# Patient Record
Sex: Female | Born: 1990 | Race: Black or African American | Hispanic: No | Marital: Married | Smoking: Never smoker
Health system: Southern US, Community
[De-identification: ages and names within clinical notes are randomized; demographics above are authoritative.]

## PROBLEM LIST (undated history)

## (undated) DIAGNOSIS — J45909 Unspecified asthma, uncomplicated: Secondary | ICD-10-CM

---

## 2018-10-29 ENCOUNTER — Emergency Department (HOSPITAL_BASED_OUTPATIENT_CLINIC_OR_DEPARTMENT_OTHER)
Admission: EM | Admit: 2018-10-29 | Discharge: 2018-10-29 | Disposition: A | Discharge status: Home / Self Care | Payer: Medicaid Other | Attending: Emergency Medicine | Admitting: Emergency Medicine

## 2018-10-29 ENCOUNTER — Other Ambulatory Visit: Payer: Self-pay

## 2018-10-29 ENCOUNTER — Encounter (HOSPITAL_BASED_OUTPATIENT_CLINIC_OR_DEPARTMENT_OTHER): Payer: Self-pay | Admitting: Emergency Medicine

## 2018-10-29 DIAGNOSIS — O99612 Diseases of the digestive system complicating pregnancy, second trimester: Secondary | ICD-10-CM | POA: Insufficient documentation

## 2018-10-29 DIAGNOSIS — R109 Unspecified abdominal pain: Secondary | ICD-10-CM | POA: Insufficient documentation

## 2018-10-29 DIAGNOSIS — Z3A27 27 weeks gestation of pregnancy: Secondary | ICD-10-CM

## 2018-10-29 HISTORY — DX: Unspecified asthma, uncomplicated: J45.909

## 2018-10-29 NOTE — ED Notes (Signed)
[facility] RN contacted [facility] RN about difficulty tracing HR. Pt. Now on monitor.   G3P2 27 weeks followed by [facility] ([facility]) and c/o lower abd pain x1week. Hx of uterine fibroid. Scheduled for ultrasound with her provider this week. Pt. Rating pain 9/10. Do not have access to prenatal records at this time.

## 2018-10-29 NOTE — ED Notes (Signed)
Category I FHR tracing. No uterine ctx noted via toco. Pt. Denies LOF or vaginal bleeding. Spoke with attending MD Dr. Elonda Husky. He is advising for her to keep her follow up appointment with her OBGYN this week. May remove from FHR monitoring at this time. Updated [facility] RN.

## 2018-10-29 NOTE — ED Provider Notes (Signed)
[facility] Provider Note   CSN: [id number] Arrival date & time: 10/29/18  1758     History   Chief Complaint Chief Complaint  Patient presents with  . Abdominal Pain    [redacted]  weeks pregnant    HPI Essynce Ozier is a 28 y.o. female.     Patient is [redacted] weeks pregnant.  Gravida 3 para 2 estimated delivery date is November 17.  Patient's been having some intermittent right flank right sided abdominal pain that felt like a little bit of contractions for the past several days.  Patient is followed by OB/GYN in the [place] area.  Is been no problems with this pregnancy to date.  Patient denies any vaginal bleeding or any discharge or breaking of her water.  Patient without any fevers or upper respiratory symptoms.     Past Medical History:  Diagnosis Date  . Asthma     There are no active problems to display for this patient.   History reviewed. No pertinent surgical history.   OB History    Gravida  1   Para      Term      Preterm      AB      Living        SAB      TAB      Ectopic      Multiple      Live Births               Home Medications    Prior to Admission medications   Not on File    Family History No family history on file.  Social History Social History   Tobacco Use  . Smoking status: Never Smoker  . Smokeless tobacco: Never Used  Substance Use Topics  . Alcohol use: Never    Frequency: Never  . Drug use: Never     Allergies   Patient has no known allergies.   Review of Systems Review of Systems  Constitutional: Negative for chills and fever.  HENT: Negative for rhinorrhea and sore throat.   Eyes: Negative for visual disturbance.  Respiratory: Negative for cough and shortness of breath.   Cardiovascular: Negative for chest pain and leg swelling.  Gastrointestinal: Positive for abdominal pain. Negative for diarrhea, nausea and vomiting.  Genitourinary: Negative for  dysuria, vaginal bleeding and vaginal discharge.  Musculoskeletal: Negative for back pain and neck pain.  Skin: Negative for rash.  Neurological: Negative for dizziness, light-headedness and headaches.  Hematological: Does not bruise/bleed easily.  Psychiatric/Behavioral: Negative for confusion.     Physical Exam Updated Vital Signs BP 125/73 (BP Location: Right Arm)   Pulse (!) 103   Temp 98.6 F (37 C) (Oral)   Resp 20   Ht 1.575 m (5\' 2" )   Wt 67.6 kg   LMP 04/18/2018 Comment: Gravida 3, Para 2, AB 0  SpO2 98%   BMI 27.25 kg/m   Physical Exam Vitals signs and nursing note reviewed.  Constitutional:      General: She is not in acute distress.    Appearance: Normal appearance. She is well-developed.  HENT:     Head: Normocephalic and atraumatic.  Eyes:     Extraocular Movements: Extraocular movements intact.     Conjunctiva/sclera: Conjunctivae normal.     Pupils: Pupils are equal, round, and reactive to light.  Neck:     Musculoskeletal: Normal range of motion and neck supple.  Cardiovascular:  Rate and Rhythm: Normal rate and regular rhythm.     Heart sounds: No murmur.  Pulmonary:     Effort: Pulmonary effort is normal. No respiratory distress.     Breath sounds: Normal breath sounds.  Abdominal:     Palpations: Abdomen is soft.     Tenderness: There is no abdominal tenderness. There is no guarding.     Comments: Abdomen soft nontender.  Gravid uterus to the umbilicus.  Musculoskeletal: Normal range of motion.  Skin:    General: Skin is warm and dry.     Capillary Refill: Capillary refill takes less than 2 seconds.  Neurological:     General: No focal deficit present.     Mental Status: She is alert and oriented to person, place, and time.      ED Treatments / Results  Labs (all labs ordered are listed, but only abnormal results are displayed) Labs Reviewed - No data to display  EKG None  Radiology No results found.  Procedures Procedures  (including critical care time)  Medications Ordered in ED Medications - No data to display   Initial Impression / Assessment and Plan / ED Course  I have reviewed the triage vital signs and the nursing notes.  Pertinent labs & imaging results that were available during my care of the patient were reviewed by me and considered in my medical decision making (see chart for details).        Maternal fetal monitoring completed.  Discussions with [facility].  Patient cleared for discharge home and follow-up with her OB/GYN doctors this week.  No evidence of any labor.  No evidence of any complications.  Final Clinical Impressions(s) / ED Diagnoses   Final diagnoses:  [redacted] weeks gestation of pregnancy    ED Discharge Orders    None       Fredia Sorrow, MD 10/29/18 [phone]

## 2018-10-29 NOTE — ED Notes (Signed)
Spoke with Jenny Reichmann, RN who is caring for pt @ [facility].  Pt has been admitted to [facility] and has started tracing for evaluation by [facility].  Monitor is currently tracing maternal heart rate so adjustment requested.

## 2018-10-29 NOTE — ED Notes (Signed)
Spoke with [place] from [facility].  Still unable to get HR to pick up for more than a second or two.  To call me back.

## 2018-10-29 NOTE — ED Notes (Signed)
Spoke with Stanton Kidney RN with [facility]

## 2018-10-29 NOTE — Discharge Instructions (Addendum)
Keep your appointment that you have with your OB/GYN for later this week.  Get seen for any vaginal bleeding or breaking of your water.  Are any worse symptoms.

## 2018-10-29 NOTE — ED Triage Notes (Signed)
Has been having abd pain, since Monday and is scheduled for a U/S by OB-GYN on 8/30.

## 2018-10-29 NOTE — ED Notes (Addendum)
Spoke with [place] from [facility].  Reports baby looks good and patient just needs to keep her previous appointment for [place].  Dr Rogene Houston made aware.

## 2018-10-29 NOTE — ED Notes (Signed)
ED Provider at bedside. 

## 2018-10-29 NOTE — ED Notes (Signed)
Patient aware of need for urine specimen.  To let me know when she can go.

## 2020-12-15 ENCOUNTER — Other Ambulatory Visit: Payer: Self-pay

## 2020-12-15 ENCOUNTER — Encounter (HOSPITAL_BASED_OUTPATIENT_CLINIC_OR_DEPARTMENT_OTHER): Payer: Self-pay

## 2020-12-15 ENCOUNTER — Emergency Department (HOSPITAL_BASED_OUTPATIENT_CLINIC_OR_DEPARTMENT_OTHER): Payer: Medicaid Other

## 2020-12-15 ENCOUNTER — Emergency Department (HOSPITAL_BASED_OUTPATIENT_CLINIC_OR_DEPARTMENT_OTHER)
Admission: EM | Admit: 2020-12-15 | Discharge: 2020-12-15 | Disposition: A | Discharge status: Home / Self Care | Payer: Medicaid Other | Attending: Student | Admitting: Student

## 2020-12-15 DIAGNOSIS — E876 Hypokalemia: Secondary | ICD-10-CM | POA: Insufficient documentation

## 2020-12-15 DIAGNOSIS — D259 Leiomyoma of uterus, unspecified: Secondary | ICD-10-CM | POA: Diagnosis not present

## 2020-12-15 DIAGNOSIS — E871 Hypo-osmolality and hyponatremia: Secondary | ICD-10-CM | POA: Insufficient documentation

## 2020-12-15 DIAGNOSIS — R110 Nausea: Secondary | ICD-10-CM | POA: Insufficient documentation

## 2020-12-15 DIAGNOSIS — Z20822 Contact with and (suspected) exposure to covid-19: Secondary | ICD-10-CM | POA: Insufficient documentation

## 2020-12-15 DIAGNOSIS — D649 Anemia, unspecified: Secondary | ICD-10-CM | POA: Insufficient documentation

## 2020-12-15 DIAGNOSIS — J45909 Unspecified asthma, uncomplicated: Secondary | ICD-10-CM | POA: Diagnosis not present

## 2020-12-15 DIAGNOSIS — R1032 Left lower quadrant pain: Secondary | ICD-10-CM | POA: Diagnosis present

## 2020-12-15 LAB — URINALYSIS, ROUTINE W REFLEX MICROSCOPIC
Bilirubin Urine: NEGATIVE
Glucose, UA: NEGATIVE mg/dL
Hgb urine dipstick: NEGATIVE
Ketones, ur: NEGATIVE mg/dL
Leukocytes,Ua: NEGATIVE
Nitrite: NEGATIVE
Protein, ur: NEGATIVE mg/dL
Specific Gravity, Urine: 1.025 (ref 1.005–1.030)
pH: 5.5 (ref 5.0–8.0)

## 2020-12-15 LAB — COMPREHENSIVE METABOLIC PANEL
ALT: 20 U/L (ref 0–44)
AST: 22 U/L (ref 15–41)
Albumin: 3.8 g/dL (ref 3.5–5.0)
Alkaline Phosphatase: 73 U/L (ref 38–126)
Anion gap: 7 (ref 5–15)
BUN: 9 mg/dL (ref 6–20)
CO2: 24 mmol/L (ref 22–32)
Calcium: 8.8 mg/dL — ABNORMAL LOW (ref 8.9–10.3)
Chloride: 103 mmol/L (ref 98–111)
Creatinine, Ser: 0.63 mg/dL (ref 0.44–1.00)
GFR, Estimated: >60 mL/min (ref >60)
Glucose, Bld: 99 mg/dL (ref 70–99)
Potassium: 3.4 mmol/L — ABNORMAL LOW (ref 3.5–5.1)
Sodium: 134 mmol/L — ABNORMAL LOW (ref 135–145)
Total Bilirubin: 0.1 mg/dL — ABNORMAL LOW (ref 0.3–1.2)
Total Protein: 8.4 g/dL — ABNORMAL HIGH (ref 6.5–8.1)

## 2020-12-15 LAB — CBC WITH DIFFERENTIAL/PLATELET
Abs Immature Granulocytes: 0.03 10*3/uL (ref 0.00–0.07)
Basophils Absolute: 0.1 10*3/uL (ref 0.0–0.1)
Basophils Relative: 1 %
Eosinophils Absolute: 0.2 10*3/uL (ref 0.0–0.5)
Eosinophils Relative: 2 %
HCT: 23.8 % — ABNORMAL LOW (ref 36.0–46.0)
Hemoglobin: 6.8 g/dL — CL (ref 12.0–15.0)
Immature Granulocytes: 0 %
Lymphocytes Relative: 19 %
Lymphs Abs: 2 10*3/uL (ref 0.7–4.0)
MCH: 17 pg — ABNORMAL LOW (ref 26.0–34.0)
MCHC: 28.6 g/dL — ABNORMAL LOW (ref 30.0–36.0)
MCV: 59.4 fL — ABNORMAL LOW (ref 80.0–100.0)
Monocytes Absolute: 0.6 10*3/uL (ref 0.1–1.0)
Monocytes Relative: 6 %
Neutro Abs: 7.4 10*3/uL (ref 1.7–7.7)
Neutrophils Relative %: 72 %
Platelets: 615 10*3/uL — ABNORMAL HIGH (ref 150–400)
RBC: 4.01 MIL/uL (ref 3.87–5.11)
RDW: 21 % — ABNORMAL HIGH (ref 11.5–15.5)
WBC: 10.2 10*3/uL (ref 4.0–10.5)
nRBC: 0 % (ref 0.0–0.2)

## 2020-12-15 LAB — LIPASE, BLOOD: Lipase: 37 U/L (ref 11–51)

## 2020-12-15 LAB — ABO/RH: ABO/RH(D): O POS

## 2020-12-15 LAB — PREPARE RBC (CROSSMATCH)

## 2020-12-15 LAB — RESP PANEL BY RT-PCR (FLU A&B, COVID) ARPGX2
Influenza A by PCR: NEGATIVE
Influenza B by PCR: NEGATIVE
SARS Coronavirus 2 by RT PCR: NEGATIVE

## 2020-12-15 LAB — PREGNANCY, URINE: Preg Test, Ur: NEGATIVE

## 2020-12-15 MED ORDER — ALBUTEROL SULFATE HFA 108 (90 BASE) MCG/ACT IN AERS
2.0000 | INHALATION_SPRAY | Freq: Once | RESPIRATORY_TRACT | Status: AC
  Administered 2020-12-15: 2 via RESPIRATORY_TRACT
  Filled 2020-12-15: qty 6.7

## 2020-12-15 MED ORDER — IOHEXOL 300 MG/ML  SOLN
100.0000 mL | Freq: Once | INTRAMUSCULAR | Status: AC | PRN
  Administered 2020-12-15: 100 mL via INTRAVENOUS

## 2020-12-15 MED ORDER — ONDANSETRON HCL 4 MG/2ML IJ SOLN
4.0000 mg | Freq: Once | INTRAMUSCULAR | Status: AC | PRN
  Administered 2020-12-15: 4 mg via INTRAVENOUS
  Filled 2020-12-15: qty 2

## 2020-12-15 MED ORDER — MORPHINE SULFATE (PF) 4 MG/ML IV SOLN
4.0000 mg | Freq: Once | INTRAVENOUS | Status: AC
  Administered 2020-12-15: 4 mg via INTRAVENOUS
  Filled 2020-12-15: qty 1

## 2020-12-15 MED ORDER — KETOROLAC TROMETHAMINE 15 MG/ML IJ SOLN
15.0000 mg | Freq: Once | INTRAMUSCULAR | Status: AC
  Administered 2020-12-15: 15 mg via INTRAVENOUS
  Filled 2020-12-15: qty 1

## 2020-12-15 MED ORDER — HYDROCODONE-ACETAMINOPHEN 5-325 MG PO TABS
1.0000 | ORAL_TABLET | Freq: Once | ORAL | Status: AC
  Administered 2020-12-15: 1 via ORAL
  Filled 2020-12-15: qty 1

## 2020-12-15 MED ORDER — SODIUM CHLORIDE 0.9 % IV SOLN
10.0000 mL/h | Freq: Once | INTRAVENOUS | Status: AC
  Administered 2020-12-15: 10 mL/h via INTRAVENOUS

## 2020-12-15 MED ORDER — FERROUS SULFATE 325 (65 FE) MG PO TABS
325.0000 mg | ORAL_TABLET | Freq: Every day | ORAL | 0 refills | Status: AC
Start: 2020-12-15 — End: ?

## 2020-12-15 MED ORDER — NAPROXEN 375 MG PO TABS: 375.0000 mg | ORAL_TABLET | Freq: Two times a day (BID) | ORAL | 0 refills | Status: AC

## 2020-12-15 MED ORDER — POTASSIUM CHLORIDE CRYS ER 20 MEQ PO TBCR
40.0000 meq | EXTENDED_RELEASE_TABLET | Freq: Once | ORAL | Status: AC
  Administered 2020-12-15: 40 meq via ORAL
  Filled 2020-12-15: qty 2

## 2020-12-15 MED ORDER — MORPHINE SULFATE (PF) 4 MG/ML IV SOLN
4.0000 mg | Freq: Once | INTRAVENOUS | Status: AC
Start: 2020-12-15 — End: 2020-12-15
  Administered 2020-12-15: 4 mg via INTRAVENOUS
  Filled 2020-12-15: qty 1

## 2020-12-15 NOTE — ED Triage Notes (Signed)
Pt c/o bilateral lower abdominal pain radiating into left flank intermittently x 1week. Denies urinary symptoms. States hx of fibroid.

## 2020-12-15 NOTE — ED Notes (Signed)
Ambulated pt to restroom without incident

## 2020-12-15 NOTE — ED Provider Notes (Signed)
4:14 PM Patient transferred from [facility] for blood transfusion. Is in 7/10 pain. Will give dose of morphine. She verbally consents to blood transfusion as well.   10:25 PM Transfusion done. Will d/c   Sherwood Gambler, MD 12/15/20 2225

## 2020-12-15 NOTE — ED Provider Notes (Addendum)
[facility] Provider Note   CSN: [id number] Arrival date & time: 12/15/20  0957     History Chief Complaint  Patient presents with   Abdominal Pain    Sharon Koch is a 30 y.o. female with PMH uterine fibroids who presents to the emergency department for evaluation of lower abdominal pain.  Patient states that her pain has been present for 1 week and is worse in the left lower quadrant but is also present in the suprapubic region and in the right lower quadrant.  She denies dysuria, vomiting, chest pain, shortness of breath, fever or other systemic symptoms.  She denies vaginal bleeding or discharge.  She states that she currently has to lie on her side to eat because sitting straight up causes significant nausea and worsening of her pain  after eating.  Of note, she also endorses positional presyncope and exertional fatigue.  Abdominal Pain Associated symptoms: fatigue and nausea   Associated symptoms: no chest pain, no chills, no cough, no dysuria, no fever, no hematuria, no shortness of breath, no sore throat and no vomiting       Past Medical History:  Diagnosis Date   Asthma     There are no problems to display for this patient.   History reviewed. No pertinent surgical history.   OB History     Gravida  1   Para      Term      Preterm      AB      Living         SAB      IAB      Ectopic      Multiple      Live Births              History reviewed. No pertinent family history.  Social History   Tobacco Use   Smoking status: Never   Smokeless tobacco: Never  Substance Use Topics   Alcohol use: Yes    Comment: occassional   Drug use: Yes    Types: Marijuana    Home Medications Prior to Admission medications   Not on File    Allergies    Shellfish allergy and Iodine  Review of Systems   Review of Systems  Constitutional:  Positive for appetite change and fatigue. Negative for chills and  fever.  HENT:  Negative for ear pain and sore throat.   Eyes:  Negative for pain and visual disturbance.  Respiratory:  Negative for cough and shortness of breath.   Cardiovascular:  Negative for chest pain and palpitations.  Gastrointestinal:  Positive for abdominal pain and nausea. Negative for vomiting.  Genitourinary:  Negative for dysuria and hematuria.  Musculoskeletal:  Negative for arthralgias and back pain.  Skin:  Negative for color change and rash.  Neurological:  Positive for syncope. Negative for seizures.  All other systems reviewed and are negative.  Physical Exam Updated Vital Signs BP 115/61 (BP Location: Right Arm)   Pulse 91   Temp 98.4 F (36.9 C) (Oral)   Resp 18   Ht 5\' 2"  (1.575 m)   Wt 68 kg   LMP 11/13/2020 (Approximate) Comment: Negative u-preg today  SpO2 100%   BMI 27.44 kg/m   Physical Exam Vitals and nursing note reviewed.  Constitutional:      General: She is not in acute distress.    Appearance: She is well-developed.  HENT:     Head: Normocephalic and atraumatic.  Eyes:     Conjunctiva/sclera: Conjunctivae normal.  Cardiovascular:     Rate and Rhythm: Normal rate and regular rhythm.     Heart sounds: No murmur heard. Pulmonary:     Effort: Pulmonary effort is normal. No respiratory distress.     Breath sounds: Normal breath sounds.  Abdominal:     Palpations: Abdomen is soft.     Tenderness: There is abdominal tenderness in the right lower quadrant, suprapubic area and left lower quadrant.     Comments: Palpable mass in lower abdomen  Musculoskeletal:     Cervical back: Neck supple.  Skin:    General: Skin is warm and dry.  Neurological:     Mental Status: She is alert.    ED Results / Procedures / Treatments   Labs (all labs ordered are listed, but only abnormal results are displayed) Labs Reviewed  CBC WITH DIFFERENTIAL/PLATELET - Abnormal; Notable for the following components:      Result Value   Hemoglobin 6.8 (*)    HCT  23.8 (*)    MCV 59.4 (*)    MCH 17.0 (*)    MCHC 28.6 (*)    RDW 21.0 (*)    Platelets 615 (*)    All other components within normal limits  COMPREHENSIVE METABOLIC PANEL - Abnormal; Notable for the following components:   Sodium 134 (*)    Potassium 3.4 (*)    Calcium 8.8 (*)    Total Protein 8.4 (*)    Total Bilirubin 0.1 (*)    All other components within normal limits  RESP PANEL BY RT-PCR (FLU A&B, COVID) ARPGX2  LIPASE, BLOOD  URINALYSIS, ROUTINE W REFLEX MICROSCOPIC  PREGNANCY, URINE  ABO/RH    EKG None  Radiology CT ABDOMEN PELVIS W CONTRAST  Result Date: 12/15/2020 CLINICAL DATA:  Left lower quadrant pain, palpable mass. Pain radiates to the left flank. EXAM: CT ABDOMEN AND PELVIS WITH CONTRAST TECHNIQUE: Multidetector CT imaging of the abdomen and pelvis was performed using the standard protocol following bolus administration of intravenous contrast. CONTRAST:  112mL OMNIPAQUE IOHEXOL 300 MG/ML  SOLN COMPARISON:  None. FINDINGS: Lower chest: Lung bases are clear. Heart size normal. No pericardial or pleural effusion. Distal esophagus is unremarkable. Hepatobiliary: Liver and gallbladder are unremarkable. No biliary ductal dilatation. Pancreas: Negative. Spleen: Negative. Adrenals/Urinary Tract: Adrenal glands and kidneys are unremarkable. Ureters are decompressed. Bladder is low in volume. Stomach/Bowel: Stomach, small bowel, appendix and colon are unremarkable. Vascular/Lymphatic: Prominent pelvic veins bilaterally, left greater than right. Vascular structures are otherwise unremarkable. No pathologically enlarged lymph nodes. Reproductive: Uterus is enlarged and contains a dominant heterogeneous rounded mass measuring approximately 9.2 x 9.3 cm. No adnexal mass. Small amount of fluid is seen in the endometrial canal. Other: Laxity of the ventral abdominal wall at the umbilicus. There may be trace pelvic free fluid. Mesenteries and peritoneum are otherwise unremarkable.  Musculoskeletal: None. IMPRESSION: 1. No acute findings. 2. Enlarged uterus containing a dominant heterogeneous mass most indicative of a fibroid. Electronically Signed   By: Lorin Picket M.D.   On: 12/15/2020 12:38    Procedures Procedures   Medications Ordered in ED Medications  ketorolac (TORADOL) 15 MG/ML injection 15 mg (15 mg Intravenous Given 12/15/20 1201)  iohexol (OMNIPAQUE) 300 MG/ML solution 100 mL (100 mLs Intravenous Contrast Given 12/15/20 1151)  morphine 4 MG/ML injection 4 mg (4 mg Intravenous Given 12/15/20 1356)    ED Course  I have reviewed the triage vital signs and the nursing notes.  Pertinent labs & imaging results that were available during my care of the patient were reviewed by me and considered in my medical decision making (see chart for details).    MDM Rules/Calculators/A&P                           Patient seen emergency department for evaluation of abdominal pain.  Physical exam reveals a palpable mass in the right and left lower quadrants that is tender to palpation.  Laboratory evaluation with a significant anemia to 6.8 and a very low MCV of 59.4.  ABO sent.  Chemistry with hypokalemia to 3.4, hyponatremia 134, LFTs unremarkable, pregnancy test negative, urinalysis unremarkable.  CT abdomen pelvis reveals a large fibroid approximately 9.2 x 9.3 cm.  The patient appears to be in a significant amount of pain, and due to an apparent enlarging of her known uterine fibroid, I spoke with her OB/GYN's at [facility] who stated that she is likely a candidate for myomectomy, but this is not an emergent procedure and this can be done in the outpatient setting.  As we are freestanding ER, she cannot receive her blood transfusion here in the emergency department, and thus will be transferred to [facility] for blood transfusion.  With the patient's MCV of 59.4, and an apparent family history of anemia, patient may benefit from an outpatient hematology work-up  for thalassemia.  Patient then transferred to [facility]. Final Clinical Impression(s) / ED Diagnoses Final diagnoses:  Anemia, unspecified type  Uterine leiomyoma, unspecified location    Rx / DC Orders ED Discharge Orders     None        Sahej Schrieber, Debe Coder, MD 12/15/20 1405    Teressa Lower, MD 12/16/20 [phone]

## 2020-12-15 NOTE — Discharge Instructions (Addendum)
You are being prescribed Naproxen. Do not take Ibuprofen/Advil/Aleve/Motrin/Goody Powders/BC powders/Meloxicam/Diclofenac/Indomethacin and other Nonsteroidal anti-inflammatory medications

## 2020-12-15 NOTE — ED Notes (Signed)
Reports from [facility], patient transfer from [facility]

## 2020-12-15 NOTE — ED Notes (Signed)
Patient reports intermittent bilateral abdominal pain x 1 -1 1/2 weeks.  Denies n/v/d, denies dysuria or vaginal discharge.

## 2020-12-16 LAB — BPAM RBC
Blood Product Expiration Date: 202211032359
ISSUE DATE / TIME: 202210101854
Unit Type and Rh: 5100

## 2020-12-16 LAB — TYPE AND SCREEN
ABO/RH(D): O POS
Antibody Screen: NEGATIVE
Unit division: 0

## 2022-10-29 IMAGING — CT CT ABD-PELV W/ CM
2 of 4 series · 16 of 46 positions shown, 18 images · IV contrast (Omnipaque)
Comparison: None.

CLINICAL DATA: Left lower quadrant pain, palpable mass. Pain
radiates to the left flank.

EXAM:
CT ABDOMEN AND PELVIS WITH CONTRAST
TECHNIQUE: Multidetector CT imaging of the abdomen and pelvis was performed
using the standard protocol following bolus administration of
intravenous contrast.
CONTRAST:  100mL OMNIPAQUE IOHEXOL 300 MG/ML  SOLN

[Series 2: axial st · axial · 0.87mm/px · z∈[-441,-36]mm · 13 of 89 slices shown, 15 images]
[im 4/89  soft-tissue]
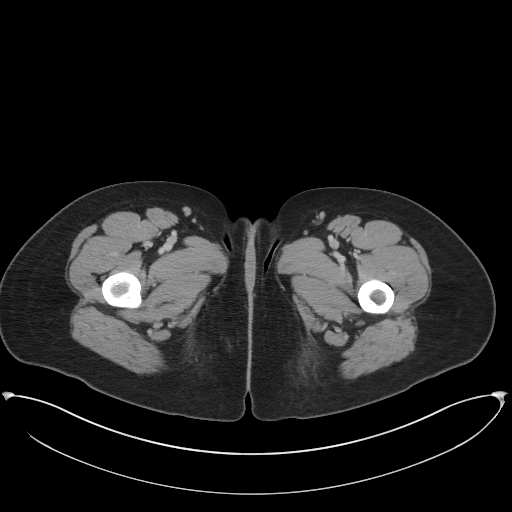
[im 4/89  bone]
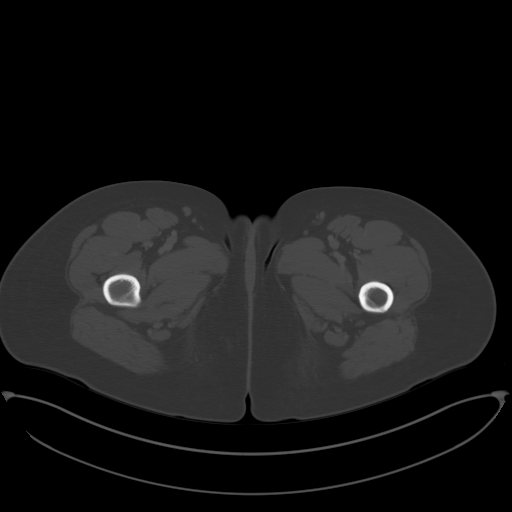
[im 12/89  soft-tissue]
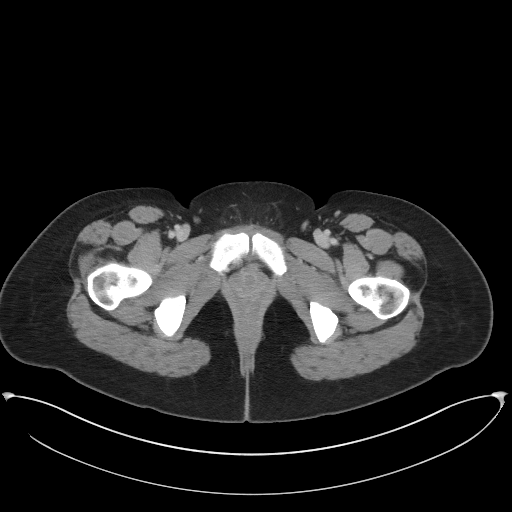
[im 19/89  soft-tissue]
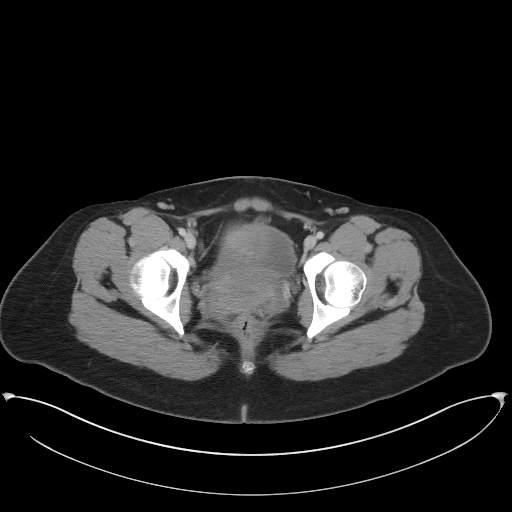
[im 26/89  soft-tissue]
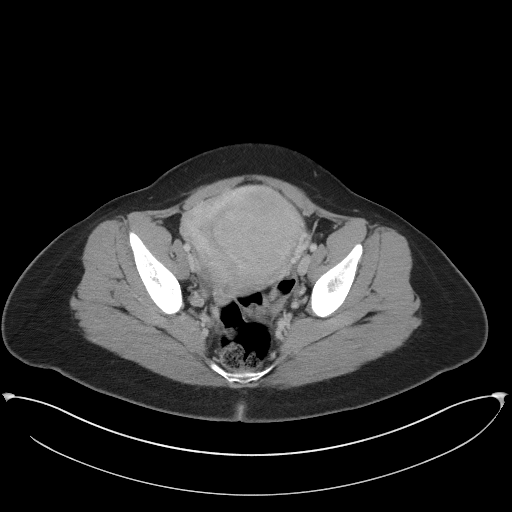
[im 30/89  soft-tissue]
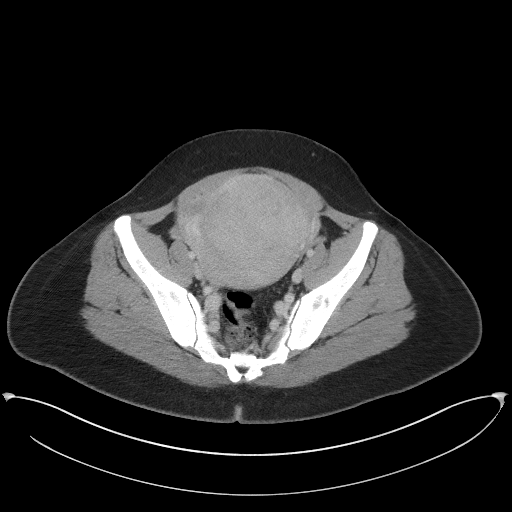
[im 37/89  soft-tissue]
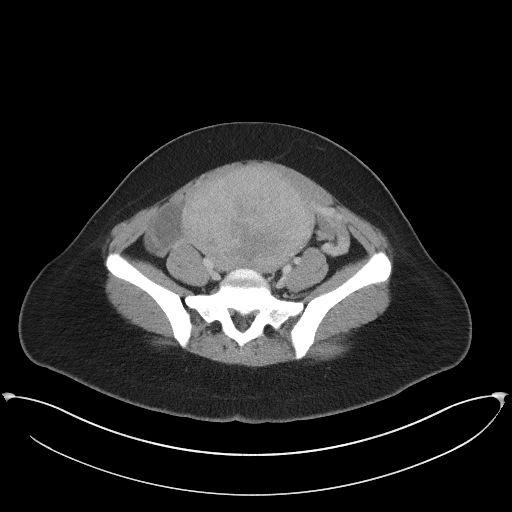
[im 45/89  soft-tissue]
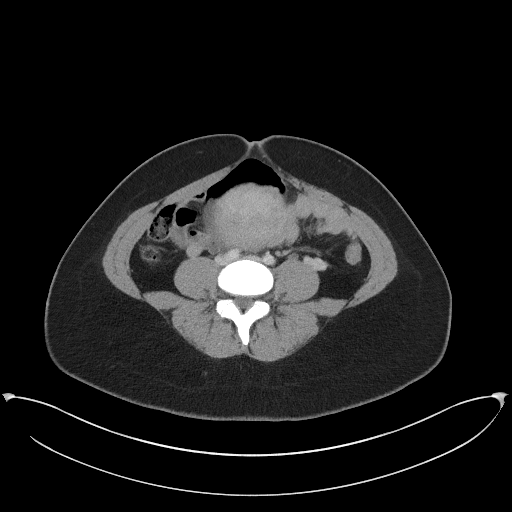
[im 52/89  soft-tissue]
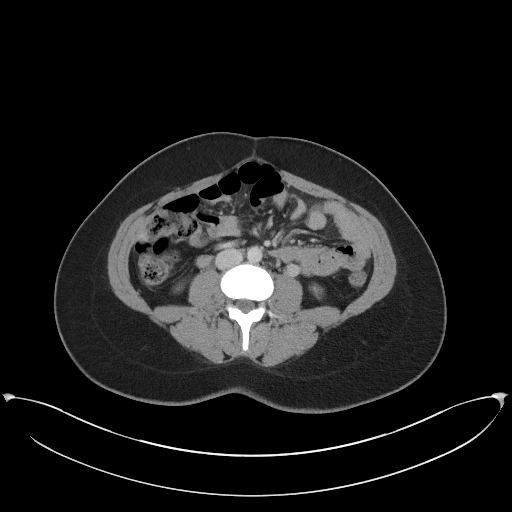
[im 59/89  soft-tissue]
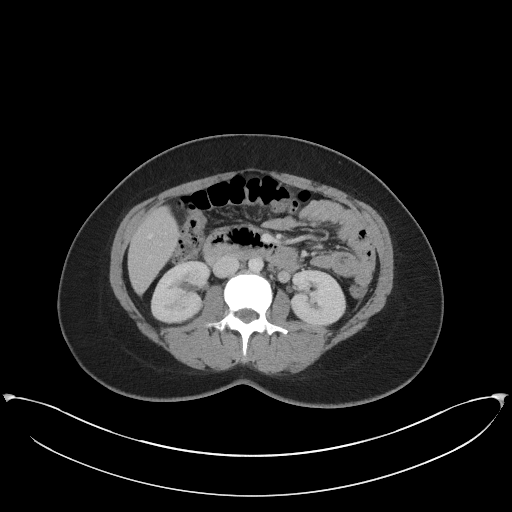
[im 59/89  bone]
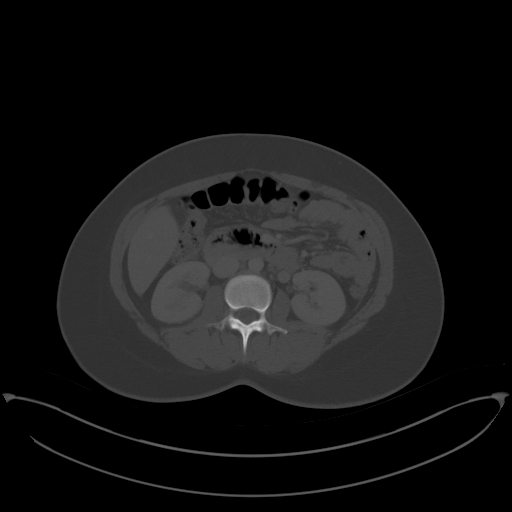
[im 63/89  soft-tissue]
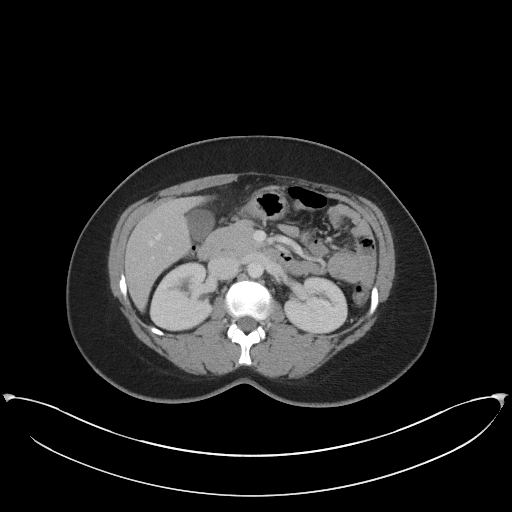
[im 70/89  soft-tissue]
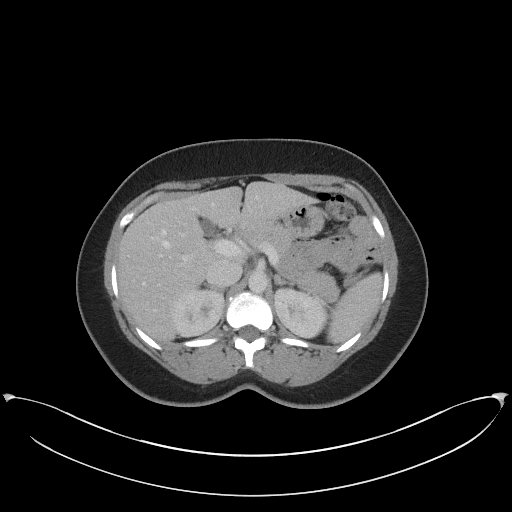
[im 78/89  soft-tissue]
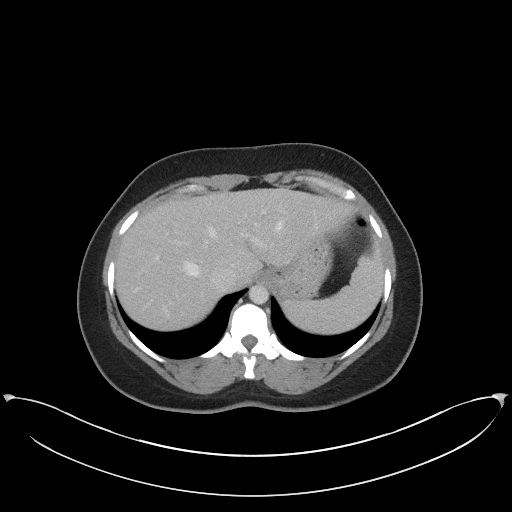
[im 85/89  soft-tissue]
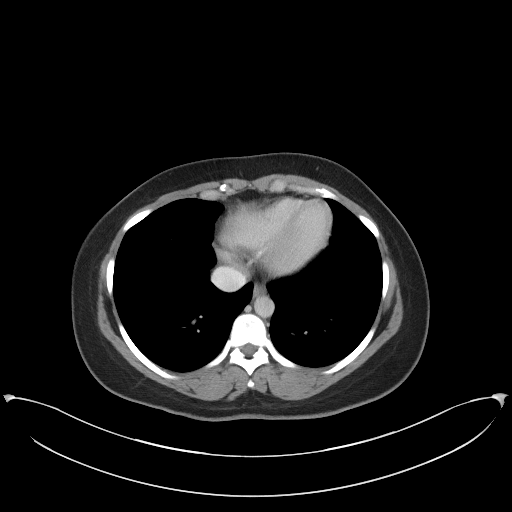

[Series 5: coronal st · coronal · 0.73mm/px · 3 of 86 slices shown]
[im 29/86  soft-tissue]
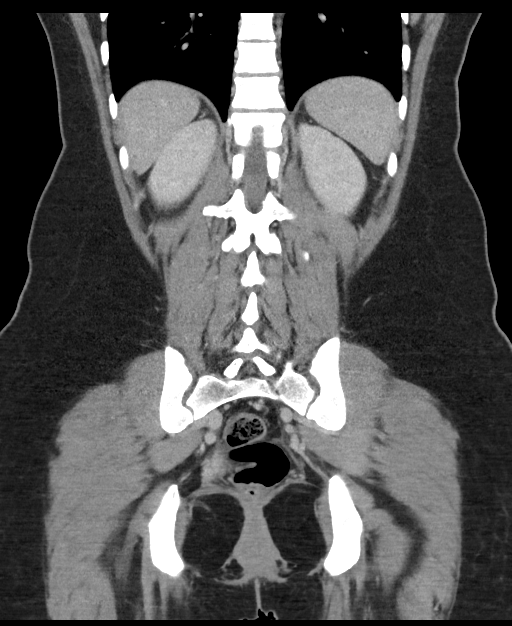
[im 38/86  soft-tissue]
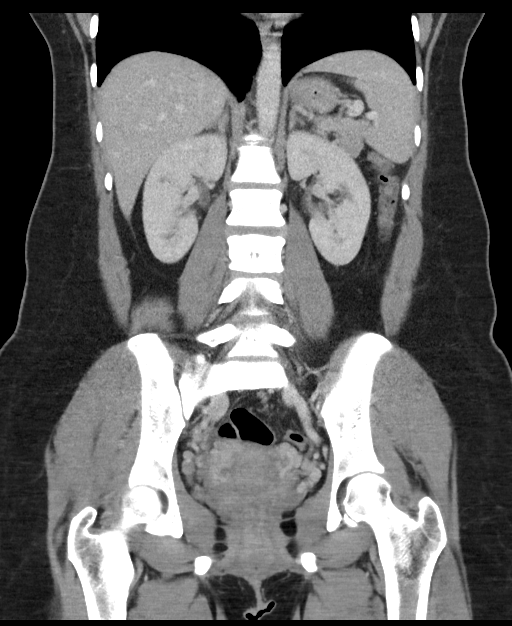
[im 48/86  soft-tissue]
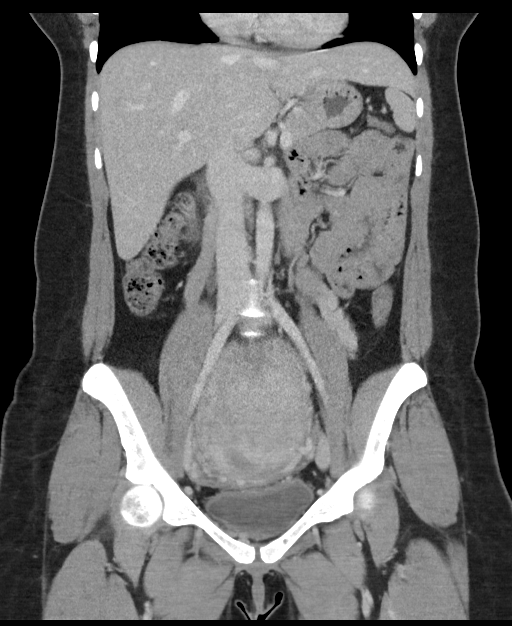

[16 of 46 positions shown; findings below may reference images not displayed]

FINDINGS: Lower chest: Lung bases are clear. Heart size normal. No pericardial
or pleural effusion. Distal esophagus is unremarkable.

Hepatobiliary: Liver and gallbladder are unremarkable. No biliary
ductal dilatation.

Pancreas: Negative.

Spleen: Negative.

Adrenals/Urinary Tract: Adrenal glands and kidneys are unremarkable.
Ureters are decompressed. Bladder is low in volume.

Stomach/Bowel: Stomach, small bowel, appendix and colon are
unremarkable.

Vascular/Lymphatic: Prominent pelvic veins bilaterally, left greater
than right. Vascular structures are otherwise unremarkable. No
pathologically enlarged lymph nodes.

Reproductive: Uterus is enlarged and contains a dominant
heterogeneous rounded mass measuring approximately 9.2 x 9.3 cm. No
adnexal mass. Small amount of fluid is seen in the endometrial
canal.

Other: Laxity of the ventral abdominal wall at the umbilicus. There
may be trace pelvic free fluid. Mesenteries and peritoneum are
otherwise unremarkable.

Musculoskeletal: None.
IMPRESSION: 1. No acute findings.
2. Enlarged uterus containing a dominant heterogeneous mass most
indicative of a fibroid.
